# Patient Record
Sex: Male | Born: 1965 | Race: White | Hispanic: No | Marital: Married
Health system: Southern US, Academic
[De-identification: ages and names within clinical notes are randomized; demographics above are authoritative.]

## PROBLEM LIST (undated history)

## (undated) DIAGNOSIS — E783 Hyperchylomicronemia: Secondary | ICD-10-CM

## (undated) DIAGNOSIS — R7303 Prediabetes: Secondary | ICD-10-CM

## (undated) DIAGNOSIS — I10 Essential (primary) hypertension: Secondary | ICD-10-CM

## (undated) HISTORY — DX: Hyperchylomicronemia: E78.3

## (undated) HISTORY — DX: Prediabetes: R73.03

## (undated) HISTORY — DX: Essential (primary) hypertension: I10

---

## 2018-01-07 ENCOUNTER — Ambulatory Visit (INDEPENDENT_AMBULATORY_CARE_PROVIDER_SITE_OTHER): Payer: No Typology Code available for payment source | Admitting: Pediatrics

## 2018-01-07 ENCOUNTER — Encounter (INDEPENDENT_AMBULATORY_CARE_PROVIDER_SITE_OTHER): Payer: Self-pay | Admitting: Pediatrics

## 2018-01-07 VITALS — BP 156/91 | HR 65 | Ht 66.93 in | Wt 159.0 lb

## 2018-01-07 DIAGNOSIS — E781 Pure hyperglyceridemia: Secondary | ICD-10-CM

## 2018-01-07 NOTE — Progress Notes (Signed)
[facility] MEDICAL OFFICE [facility]  [facility] ENDOCRINE  [address]  [place] [address]  [phone]        Patient name:  Jimmy Ramirez    Referring Provider:   MRN:  [id number]  PCP: none  Date of Birth:  16-Nov-1965  Date of Service:  01/07/2018    Informant: patient and his wife    History of Present Illness:  Jimmy Ramirez is a 52 y.o. 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 Essie Christine sees nephew Jimmy Ramirez, [place], with Jeral Pinch Syndrome and concern for elevated TG/cholesterol levels). At [facility] most recent appointment, Hansen's wife was present and concerned for Elick and therefore wanted to see endocrinology.    Routine blood work in past with extremely elevated TG >4000. He has had extensive workup for this and has decreased carbs in diet and eats a keto-diet. His TG decreased but he himself was still concerned so in 07/06/2000 he ordered labwork which showed elevated total cholesterol 334 and TG 768 with a low HDL 29. Liproprotein type IV, absent chylomicrons. No history of pancreatitis. In the past 3 years, he notes that he was recently diagnosed with HTN (lisinopril 40mg  qDaily) and open-angle glaucoma (Dr. Harrel Lemon in [place]). Kahne states he started himself on atorvastatin 20mg  daily due to elevated cholesterol to see if that would help decrease it further. He also mentions elevated blood viscosity for which he has underwent plasmapheresis. He continues to stay active: training for triathlons, Crossfit 3x/week, etc.     3 years ago, in 2016, [place] sister had venous sinus thrombosis in head. She received Pradaxa (dabigatran etexilate) and since then, cholesterol levels and TG all improved and now within normal range. Sister has antiphospholipidemia and "chylomicronemia." "Chylomicronemia" also in father and 2 paternal aunts.     Has a 70 year old daughter who has not had any workup for hypertriglyceridemia.     Past Medical History:   Previous Hospitalizations:  none  Previous Surgical Procedures: b/l hernia repair. Right ring finger with plate and screw.    Patient Active Problem List   Diagnosis   . Hypertriglyceridemia, familial     Outpatient Encounter Medications as of 01/07/2018   Medication Sig Dispense Refill   . atorvastatin (LIPITOR) 20 mg Oral Tablet TAKE 1 TABLET BY MOUTH EVERY DAY  4   . lisinopril (PRINIVIL) 40 mg Oral Tablet TAKE 1 TABLET BY MOUTH EVERY DAY  4     No facility-administered encounter medications on file as of 01/07/2018.       NKDA.    Immunization: Up to date.    Diet: Well-balanced.     Family History: Sister with antiphospholipidemia and "chylomicronemia." "Chylomicronemia" also in father and 2 paternal aunts.     Social History: Works as a Advice worker; surgical and dermatology in past. Lives with wife and 13yo daughter.    Review of Systems:   Constitutional: Good energy, no unexplained weight loss   HEENT: No sinus infection, runny nose, cough, sore throat  Eyes: No photophobia, redness   CVS: No chest pain, palpitation, murmur   Resp: No dyspnea, wheezing.  GI: No constipation, no vomiting, diarrhea, abdominal pain   GU: No hematuria, dysuria, or urgency  MS: No joint pain, swelling, limping   Skin: No rash, pruritus, lesions   Endocrine: No thyroid problem, polyuria, polydipsia   All/Imm: No conjunctivitis, wheezing, frequent infections   Heme: No easy bruising, no epistaxis  Neuro: No headache, seizures, muscle  weakness   Psychiatric: No depression, conduct disorder     PHYSICAL EXAM:  BP (!) 156/91   Pulse 65   Ht 1.7 m (5' 6.93")   Wt 72.1 kg (158 lb 15.2 oz)   BMI 24.95 kg/m     General: NAD. Wears glasses.  Skin: Color and turgor normal.  HEENT: Throat without exudates or redness, tonsils not enlarged, neck supple, full ROM, no lymphadenopathy and thyroid not enlarged. Cobblestoning of posterior oropharynx.   Eyes: No ptosis or nystagmus, sclera and conjunctiva normal   Ears:  External ear normal, no drainage, tags, or pits    Respiratory: Clear to auscultation b/l, no RRW.  Cardiovascular: S1 and S2 present, no MRG.  GI: Abdomen soft, non-tender, non-distended, + bowel sounds, no HSM.  Musculoskeletal: Moves all 4 extremities well, no joint swelling, erythema, and tenderness, gross deformity, muscular atrophy or appliances   Neurologic: Alert, oriented, cranial nerves grossly intact, normal, gross motor movement normal.  Psychiatric: Appropriate mood and affect. Non-tangential speech. Answered questions appropriately. Speech with normal tone and volume.    ASSESSMENT/PLAN:      Type IV Dyslipidemia (Familial hypertriglyceridemia)  Elevated BP  Open-angle glaucoma  Allergic rhinits    - Repeat fasting labs tomorrow: CMP, lipid panel. Consider further advanced testing for dyslipidemia (angioproteins, etc). Send genetics for ApoE2 mutation analysis. Continue low carb diet and healthy fats as a part of paleo diet.  - Recommend [place] establish care with a PCP to help coordinate care.  - Recommend referral to cardiologist by PCP for elevated BP.    I have seen and examined the patient. I agree with Dr.Lim's note above with some modifications that I have made.    Total time spent with patient/family  30  minutes with more than 50% in counseling.      Altamese Carolina, MD PhD  Section Head, Pediatric Endocrinology  Associate Professor of Pediatrics,  [place], [place] Division

## 2018-01-11 ENCOUNTER — Telehealth (INDEPENDENT_AMBULATORY_CARE_PROVIDER_SITE_OTHER): Payer: Self-pay | Admitting: Pediatrics

## 2018-01-11 NOTE — Telephone Encounter (Signed)
Called about getting labs done. He will call us Monday to fax lab results. He also needs a follow up in a few months.

## 2018-03-11 ENCOUNTER — Ambulatory Visit (INDEPENDENT_AMBULATORY_CARE_PROVIDER_SITE_OTHER): Payer: No Typology Code available for payment source | Admitting: Pediatrics

## 2018-03-11 ENCOUNTER — Encounter (INDEPENDENT_AMBULATORY_CARE_PROVIDER_SITE_OTHER): Payer: Self-pay | Admitting: Pediatrics

## 2018-03-11 VITALS — BP 146/80 | HR 59 | Ht 69.13 in | Wt 162.3 lb

## 2018-03-11 DIAGNOSIS — J309 Allergic rhinitis, unspecified: Secondary | ICD-10-CM

## 2018-03-11 DIAGNOSIS — R030 Elevated blood-pressure reading, without diagnosis of hypertension: Secondary | ICD-10-CM

## 2018-03-11 DIAGNOSIS — E781 Pure hyperglyceridemia: Principal | ICD-10-CM

## 2018-03-12 NOTE — Progress Notes (Signed)
[facility]  [facility]  [address]  [place] [id number]  [phone]        Patient name:  Jimmy Ramirez    Referring Provider:   MRN:  [id number]  PCP: none  Date of Birth:  1965-12-02  Date of Service:  03/11/2018    Informant: patient and his wife    History of Present Illness:  Jimmy Ramirez is a 52 y.o. 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 Essie Ramirez sees nephew Jimmy Ramirez(Jimmy Ramirez, [place]14yo, with Jeral Pinchowns Syndrome and concern for elevated TG/cholesterol levels). At [facility], Jimmy Ramirez's wife was present and concerned for Jimmy Ramirez and therefore wanted to see endocrinology.    Routine blood work in past with extremely elevated TG >4000. He has had extensive workup for this and has decreased carbs in diet and eats a keto-diet. His TG decreased but he himself was still concerned so in 07/06/2000 he ordered labwork which showed elevated total cholesterol 334 and TG 768 with a low HDL 29. Liproprotein type IV, absent chylomicrons. No history of pancreatitis. In the past 3 years, he notes that he was recently diagnosed with HTN (lisinopril 40mg  qDaily) and open-angle glaucoma (Jimmy Ramirez in [place]). Jimmy Ramirez states he started himself on atorvastatin 20mg  daily due to elevated cholesterol to see if that would help decrease it further. He also mentions elevated blood viscosity for which he has underwent plasmapheresis. He continues to stay active: training for triathlons, Crossfit 3x/week, etc.     3 years ago, in 2016, Jimmy Ramirez's sister had venous sinus thrombosis in head. She received Jimmy Ramirez (dabigatran etexilate) and since then, cholesterol levels and TG all improved and now within normal range. Sister has antiphospholipidemia and "chylomicronemia." "Chylomicronemia" also in father and 2 paternal aunts.     Has a 51 year old daughter who has not had any workup for hypertriglyceridemia.     Interval Hx: Since his last visit 2 months ago he has been  more tired. He was on testosterone shots before but these were held due to polycythemia 5 months ago. He also continues to challenge his diet wanting to fast 16 hrs and eat 8 hrs with more paleo diet than keto diet. Labwork 12/2017 which showed elevated total cholesterol 295 and TG 678 with a low HDL 35, low testosterone 258 with high normal LH and FSH.    Past Medical History:   Previous Hospitalizations: none  Previous Surgical Procedures: b/l hernia repair. Right ring finger with plate and screw.    Patient Active Problem List   Diagnosis   . Hypertriglyceridemia, familial     Outpatient Encounter Medications as of 03/11/2018   Medication Sig Dispense Refill   . atorvastatin (LIPITOR) 20 mg Oral Tablet TAKE 1 TABLET BY MOUTH EVERY DAY  4   . lisinopril (PRINIVIL) 40 mg Oral Tablet TAKE 1 TABLET BY MOUTH EVERY DAY  4     No facility-administered encounter medications on file as of 03/11/2018.       NKDA.    Immunization: Up to date.    Diet: Well-balanced.     Family History: Sister with antiphospholipidemia and "chylomicronemia." "Chylomicronemia" also in father and 2 paternal aunts.     Social History: Works as a Advice workerphysician assistant; surgical and dermatology in past. Lives with wife and 13yo daughter.    Review of Systems:   Constitutional: Good energy, no unexplained weight loss   HEENT: No sinus infection, runny nose, cough, sore  throat  Eyes: No photophobia, redness   CVS: No chest pain, palpitation, murmur   Resp: No dyspnea, wheezing.  GI: No constipation, no vomiting, diarrhea, abdominal pain   GU: No hematuria, dysuria, or urgency  MS: No joint pain, swelling, limping   Skin: No rash, pruritus, lesions   Endocrine: No thyroid problem, polyuria, polydipsia   All/Imm: No conjunctivitis, wheezing, frequent infections   Heme: No easy bruising, no epistaxis  Neuro: No headache, seizures, muscle weakness   Psychiatric: No depression, conduct disorder     PHYSICAL EXAM:  BP (!) 146/80 (Site: Right, Cuff Size:  Adult Large)   Pulse 59   Ht 1.756 m (5' 9.13")   Wt 73.6 kg (162 lb 4.1 oz)   BMI 23.87 kg/m     General: NAD. Wears glasses.  Skin: Color and turgor normal.  HEENT: Throat without exudates or redness, tonsils not enlarged, neck supple, full ROM, no lymphadenopathy and thyroid not enlarged. Cobblestoning of posterior oropharynx.   Eyes: No ptosis or nystagmus, sclera and conjunctiva normal   Ears:  External ear normal, no drainage, tags, or pits   Respiratory: Clear to auscultation b/l, no RRW.  Cardiovascular: S1 and S2 present, no MRG.  GI: Abdomen soft, non-tender, non-distended, + bowel sounds, no HSM.  Musculoskeletal: Moves all 4 extremities well, no joint swelling, erythema, and tenderness, gross deformity, muscular atrophy or appliances   Neurologic: Alert, oriented, cranial nerves grossly intact, normal, gross motor movement normal.  Psychiatric: Appropriate mood and affect. Non-tangential speech. Answered questions appropriately. Speech with normal tone and volume.    ASSESSMENT/PLAN:      Type IV Dyslipidemia (Familial hypertriglyceridemia)  Elevated BP  Open-angle glaucoma  Allergic rhinitis  Low testosterone    - Repeat fasting labs show normal LH and FSH but low testosterone, high TGs and cholesterol. Consider further advanced genetic testing for dyslipidemia ApoE2 mutation analysis. Consider sending a sample through [facility]. Gave him a specimen box.   - Continue low carb diet and healthy fats as a part of paleo diet with 16:8 protocol  - Recommend Jimmy Ramirez establish care with a PCP to help coordinate care. Consider taking clomid for low testosterone levels.  - Recommend referral to cardiologist by PCP for elevated BP.  - Consider starting low dose metformin as that may help with insulin sensitization. Hold if plans to fast in the morning.    Follow up in 4-6 months.    Total time spent with patient/family  30  minutes with more than 50% in counseling.      Altamese Carolina, MD PhD  Section Head,  Pediatric Endocrinology  Associate Professor of Pediatrics,  [place], [place] Division

## 2018-05-06 ENCOUNTER — Telehealth (INDEPENDENT_AMBULATORY_CARE_PROVIDER_SITE_OTHER): Payer: Self-pay | Admitting: Pediatrics

## 2018-05-06 DIAGNOSIS — E781 Pure hyperglyceridemia: Secondary | ICD-10-CM

## 2018-05-06 NOTE — Telephone Encounter (Signed)
Patient is calling asking about a cardiology referral and if you had any recommendations for him to see someone. He said it is about Chylomicronemia. He also asked if you or someone in the office could give him a call back

## 2018-05-24 NOTE — Telephone Encounter (Signed)
Jimmy Carolina, MD  You 50 minutes ago (12:05 PM)      I have referred him to Dr. Jetta Lout adult cardiology as he is a complicated patient.    Routing comment

## 2018-06-10 ENCOUNTER — Telehealth (INDEPENDENT_AMBULATORY_CARE_PROVIDER_SITE_OTHER): Payer: Self-pay | Admitting: Pediatrics

## 2018-06-10 NOTE — Telephone Encounter (Signed)
Spoke with [name] regarding his appointment with Dr. Jetta Lout on Jun 18, 2018 @945  am

## 2022-05-17 ENCOUNTER — Ambulatory Visit (HOSPITAL_COMMUNITY): Payer: Self-pay | Admitting: Family Medicine

## 2022-12-14 IMAGING — MR MRI LUMBAR SPINE WITHOUT CONTRAST
4 of 6 series · 30 of 48 positions shown · IV contrast (gadolinium)
Comparison: None available.

﻿EXAM:  [id number]   MRI LUMBAR SPINE WITHOUT CONTRAST
INDICATION: 56-year-old with right gluteal pain.  Low back pain.  Sustained injury running. No history of back surgery or malignancy.
TECHNIQUE: Multiplanar multisequential MRI of the  was performed without gadolinium contrast.

[Series 5: T2 · sagittal · 4.0mm · 0.94mm/px · 7 of 13 slices shown (1 of 3)]
[im 1/13]
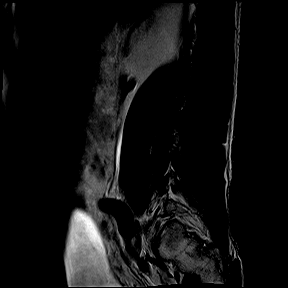
[im 3/13]
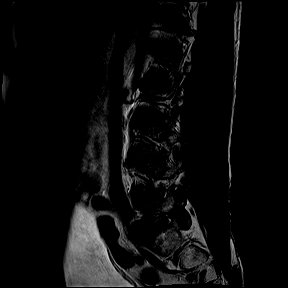
[im 5/13]
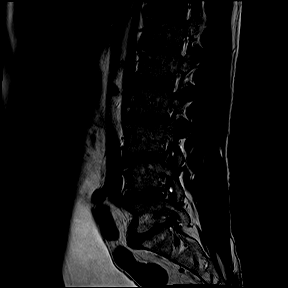
[im 7/13]
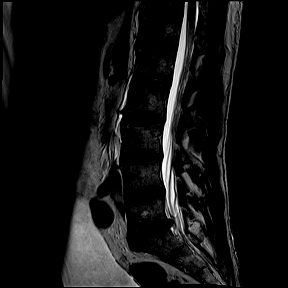
[im 9/13]
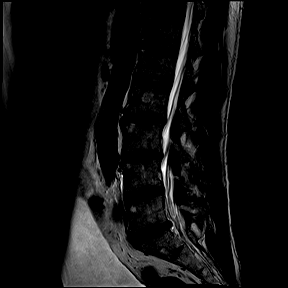
[im 11/13]
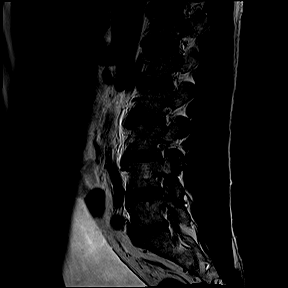
[im 13/13]
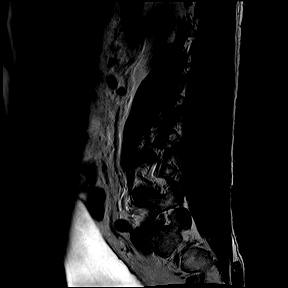

[Series 6: T1 · sagittal · 4.0mm · 0.94mm/px · 6 of 13 slices shown]
[im 1/13]
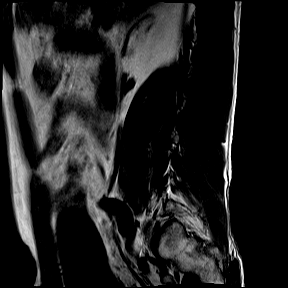
[im 3/13]
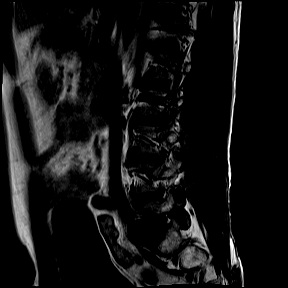
[im 5/13]
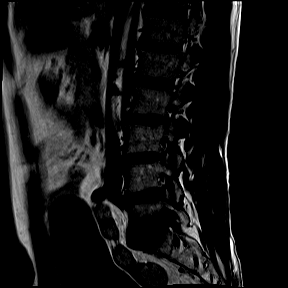
[im 8/13]
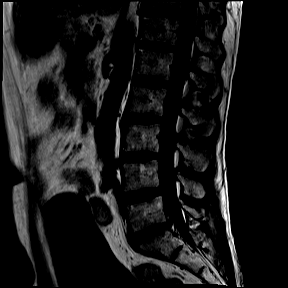
[im 10/13]
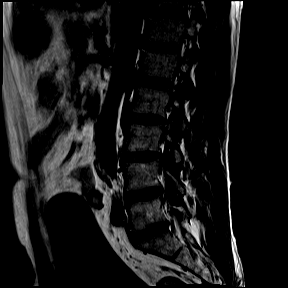
[im 13/13]
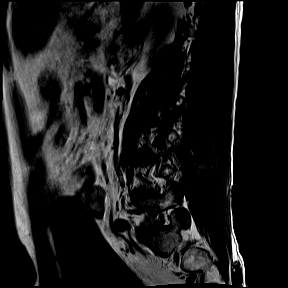

[Series 8: T2 · coronal · 5.0mm · 0.82mm/px · 9 of 18 slices shown (2 of 3)]
[im 1/18]
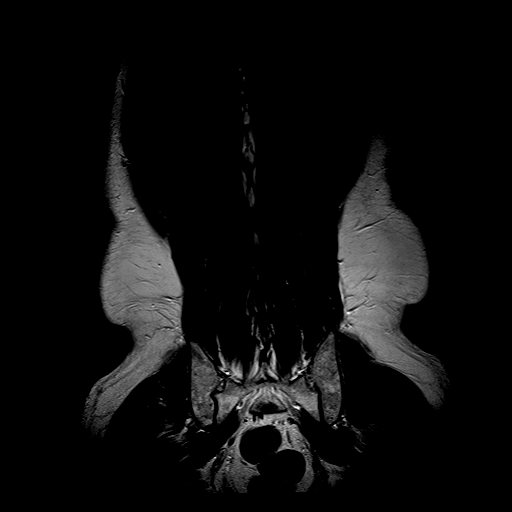
[im 3/18]
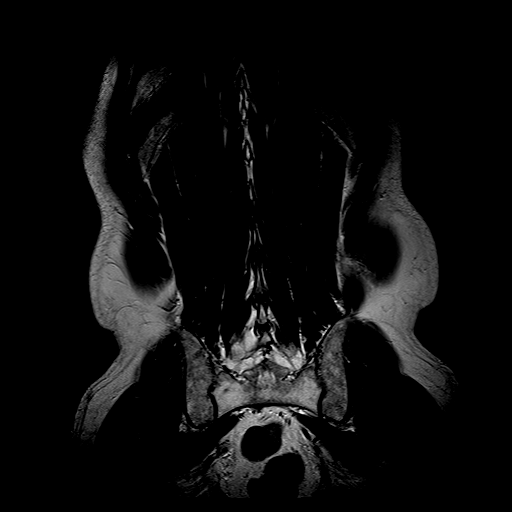
[im 5/18]
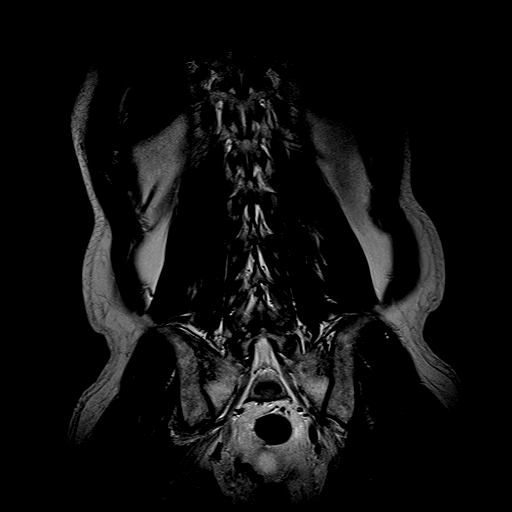
[im 7/18]
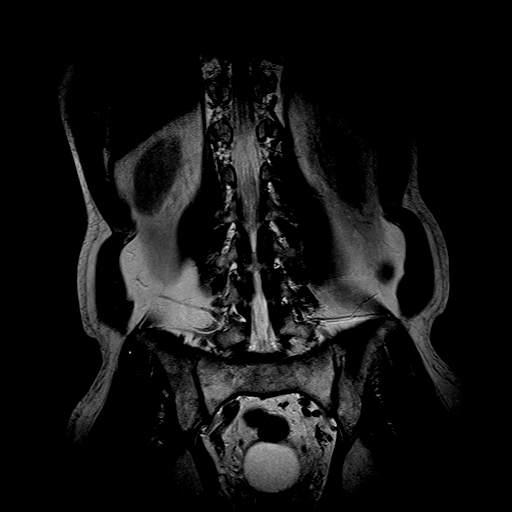
[im 9/18]
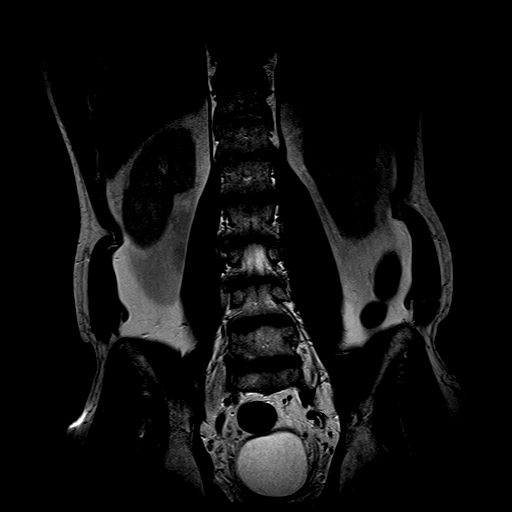
[im 11/18]
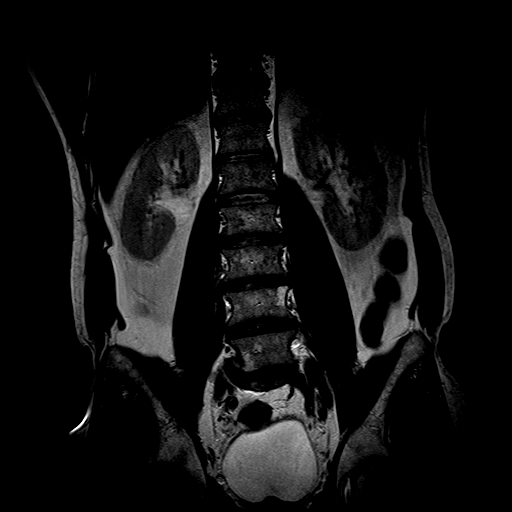
[im 13/18]
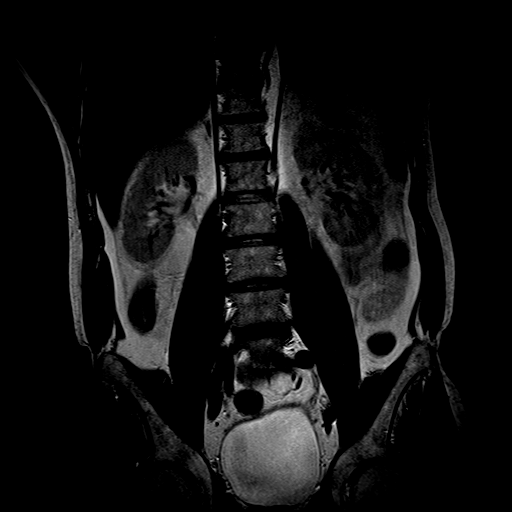
[im 15/18]
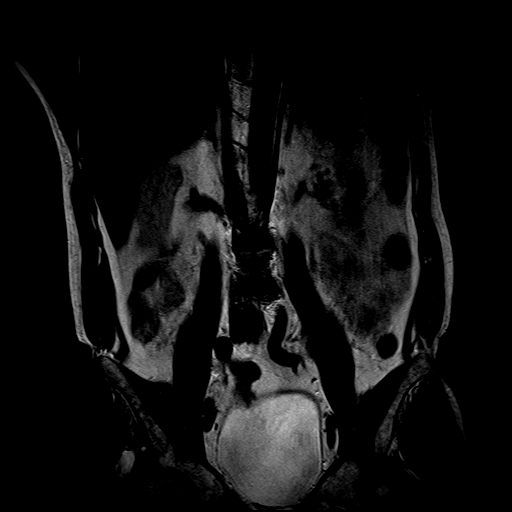
[im 18/18]
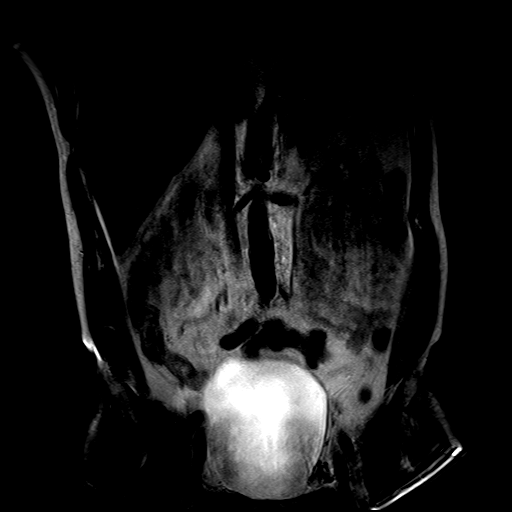

[Series 9: T2 · axial · 4.0mm · 0.52mm/px · z∈[-143,+58]mm · 8 of 20 slices shown (3 of 3)]
[im 1/20]
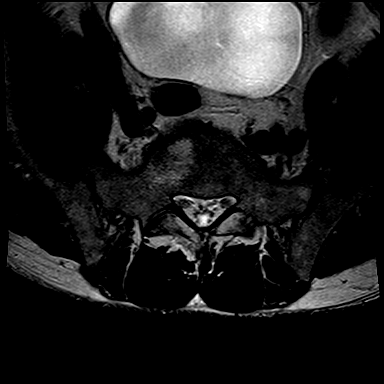
[im 3/20]
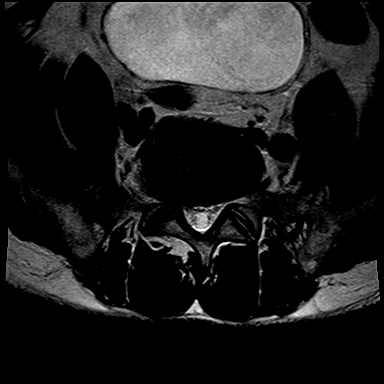
[im 7/20]
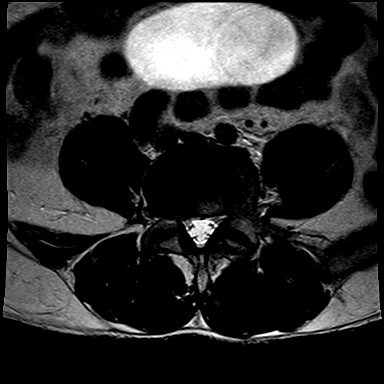
[im 9/20]
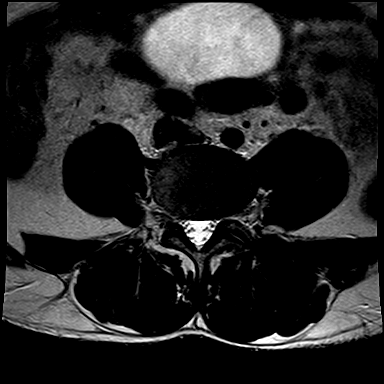
[im 11/20]
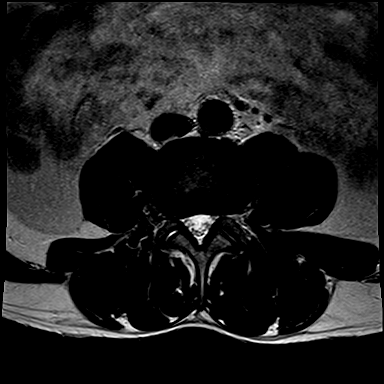
[im 13/20]
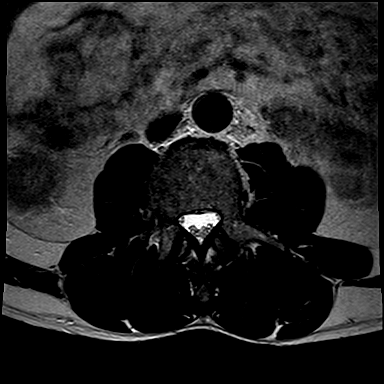
[im 17/20]
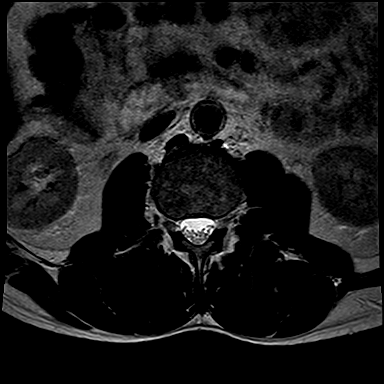
[im 20/20]
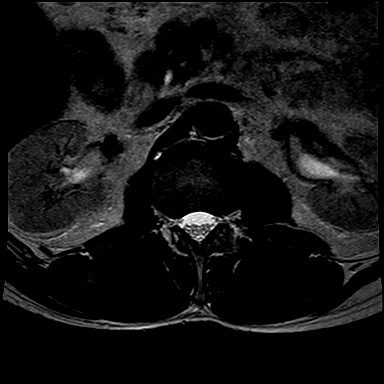

[30 of 48 positions shown; findings below may reference images not displayed]

FINDINGS: Significant Modic type 1 inflammatory changes on both sides of L5-S1 disc is noted, predominantly on the right side. 

Conus terminates at T12-L1 level. Cauda equina structures are normal in the sagittal and axial projections.

At L1-2 level, no focal disc lesions are seen. Mild facet arthropathy is noted without compromise of thecal sac or neural foramina. 

At L2-3 level, degenerative disc disease and facet arthropathy are noted with asymmetric bulging annulus to the right causing moderate compromise of right neural foramina and minimal compromise of left neural foramina.

At L3-4 level, degenerative disc disease and facet arthropathy are noted causing moderate biforaminal narrowing.  AP diameter of thecal sac in the midline measures 11.7 mm. 

At L4-5 level, degenerative disc disease is noted with significant facet arthropathy causing significant compromise of right lateral recess and neural foramina, moderate compromise of left neural foramina.

At L5-S1 level, significant degenerative disc disease with minimal retrolisthesis.  Significant, asymmetric bulging annulus to the right along with facet arthropathy is causing significant right foraminal stenosis impinging on the right L5 nerve root.  Modic inflammatory changes on the right side at L5-S1 level is noted on both sides.

Paravertebral soft tissues are unremarkable.  On the coronal images, significant degenerative changes of the acetabular labrum is suggested in the superior aspect of the right hip with a large probable paralabral cyst, 2 x 1.4 cm in coronal dimensions.
IMPRESSION: 1. Significant degenerative changes with Modic type 1 inflammatory changes on both sides of L5-S1 disc predominantly on the right side. 

2.  At L5-S1 level, significant degenerative disc disease with minimal retrolisthesis.  Significant, asymmetric bulging annulus to the right along with facet arthropathy is causing significant right foraminal stenosis impinging on the right L5 nerve root.  Modic inflammatory changes on the right side at L5-S1 level is noted on both sides.

3. Findings at other disc levels are described above in detail.

4. In the coronal images, degenerative changes of the acetabular labrum with a large paralabral cyst is suggested.  Images of the hips are incompletely evaluated in this examination of MRI lumbar spine.

## 2023-10-18 ENCOUNTER — Other Ambulatory Visit (HOSPITAL_COMMUNITY): Payer: Self-pay | Admitting: PSYCHIATRY AND NEUROLOGY-NEUROLOGY

## 2023-10-18 DIAGNOSIS — G20A1 Parkinson's disease without dyskinesia, without mention of fluctuations: Secondary | ICD-10-CM

## 2023-10-23 ENCOUNTER — Other Ambulatory Visit (HOSPITAL_COMMUNITY): Payer: Self-pay | Admitting: PSYCHIATRY AND NEUROLOGY-NEUROLOGY

## 2023-10-23 DIAGNOSIS — G20A1 Parkinson's disease without dyskinesia, without mention of fluctuations: Secondary | ICD-10-CM

## 2023-11-01 ENCOUNTER — Ambulatory Visit (INDEPENDENT_AMBULATORY_CARE_PROVIDER_SITE_OTHER): Payer: Self-pay | Admitting: NEUROLOGY

## 2023-11-15 ENCOUNTER — Ambulatory Visit (HOSPITAL_BASED_OUTPATIENT_CLINIC_OR_DEPARTMENT_OTHER): Payer: Self-pay

## 2023-11-15 ENCOUNTER — Encounter (INDEPENDENT_AMBULATORY_CARE_PROVIDER_SITE_OTHER): Payer: Self-pay | Admitting: Neurology

## 2023-11-15 ENCOUNTER — Other Ambulatory Visit: Payer: Self-pay

## 2023-11-15 ENCOUNTER — Ambulatory Visit: Payer: Self-pay | Attending: Neurology | Admitting: Neurology

## 2023-11-15 VITALS — BP 146/88 | HR 73 | Temp 97.5°F | Wt 168.9 lb

## 2023-11-15 DIAGNOSIS — R251 Tremor, unspecified: Secondary | ICD-10-CM | POA: Insufficient documentation

## 2023-11-15 DIAGNOSIS — R7303 Prediabetes: Secondary | ICD-10-CM

## 2023-11-15 DIAGNOSIS — G250 Essential tremor: Secondary | ICD-10-CM

## 2023-11-15 DIAGNOSIS — I10 Essential (primary) hypertension: Secondary | ICD-10-CM

## 2023-11-15 DIAGNOSIS — E783 Hyperchylomicronemia: Secondary | ICD-10-CM | POA: Insufficient documentation

## 2023-11-15 MED ORDER — PRIMIDONE 50 MG TABLET
50.0000 mg | ORAL_TABLET | Freq: Every evening | ORAL | 1 refills | Status: DC
Start: 2023-11-15 — End: 2023-12-04

## 2023-11-15 NOTE — H&P (Signed)
 [facility] Department of Neurosurgery      Operated by [employer]  History and Physical  Date:  11/16/2023  Name: Jimmy Ramirez  MRN: [id number]  Age:  58 y.o.  Referring Physician: No referring provider defined for this encounter.    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     Jimmy Ramirez is here with his wife, Rocky.     History Obtained from:  Patient, Family, and medical chart    HPI: I had the pleasure of seeing Jimmy Ramirez in the [facility] clinic. Jimmy Ramirez is a 58 y.o. RHD male with PMH significant for hypertriglyceridemia, HTN, chylomicronemia (follows with a cardiologist - underwent recent CTA which he reports is normal), prediabetes, and tremor presents today for surgical evaluation of tremor. He endorses a 7 year history of tremor first noticed in both hands and has progressed to LLE. He endorses increasing difficulty with running and balance. He is an avid runner and exercises 6x/week. He states the tremor is worse at rest and with anxiety and improves with activity. He recently underwent a skin biopsy that was positive for alpha synuclein; results from Dr. Nanetta office are scanned into Careers information officer. 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 While he has not noticed his tremor causing issues with his fine motor tasks at work, his rest tremor and slowness/fatigue is affecting his work and patient interactions. He has had to decrease his procedures from 12 to 10 per week and is making work adjustments to accommodate for increasing slowness and tremor. He has tried primidone , propranolol, and sinemet in the past at low doses. He would like to be able to continue working and performing procedures without interruption.      CC:    Chief Complaint   Patient presents with    New Patient       Onset: 7 years ago - b/l hand tremor that has progressed to LLE tremor  Location/laterality: L>R  Provocation: rest, anxiety  Alleviating: activity  Impact on ADL's/QOL: impacts interactions with patients and ability to work - has had to decrease number of procedure cases per week. Patients comment on his tremor during evaluations.      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; he is also a white water guide     Tremor ROS:     Motor:  Resting tremor: LUE, LLE  Rigidity: Not present  Freezing: Denies  Bradykinesia: Wife states he does have slowness of movement and becomes much slower throughout the day  Postural instability/increase in falls: Has noticed some balance issues when running    Cognitive:   Word finding difficulty: Denies  Changes in level of consciousness, cognition or memory: Denies    Psychologically:  REM sleep behavior: Has some issues acting out dreams  Vivid dreams: Present  Impulse control: Denies  Depression: Denies  Visual hallucinations: Denies    Dysautonomic:  Orthostatic  hypotension: Denies  Constipation: Denies  Temperature dysregulation: Denies  Anosmia: Does endorse reduced SOS      Family history of tremor: Denies family Hx of PD or ET    Pertinent medical history: hypertriglyceridemia, HTN, chylomicronemia (follows with a cardiologist - underwent recent CTA which he reports is normal), prediabetes    Review of Systems  Other than ROS in the HPI, all other systems were negative.      Medications:   Outpatient Medications Marked as Taking for the 11/15/23 encounter (Office Visit) with Nasya Vincent R, MD    Medication Sig    amLODIPine (NORVASC) 10 mg Oral Tablet     cholecalciferol, vitamin D3, 25 mcg (1,000 unit) Oral Tablet Take 1 Tablet (1,000 Units total) by mouth Daily    dapagliflozin propanediol (FARXIGA) 10 mg Oral Tablet Take 0.5 Tablets (5 mg total) by mouth Once a day    dapagliflozin propanediol (FARXIGA) 5 mg Oral Tablet Take 1 Tablet (5 mg total) by mouth Daily    fenofibrate (LOFIBRA) 160 mg Oral Tablet Take 1 Tablet (160 mg total) by mouth Daily    lisinopriL (PRINIVIL) 20 mg Oral Tablet Take 1 Tablet (20 mg total) by mouth Twice daily    primidone  (MYSOLINE ) 50 mg Oral Tablet Take 1 Tablet (50 mg total) by mouth Every night    REPATHA SURECLICK 140 mg/mL Subcutaneous Pen Injector Subcutaneous, 0 Refill(s)       Allergies: Allergies[1]    Family History:   Family Medical History:    None           Surgical History:         Social History:    Social History     Socioeconomic History    Marital status: Single   Tobacco Use    Smoking status: Never    Smokeless tobacco: Never         PHYSICAL EXAM:    Constitutional:  BP (!) 146/88   Pulse 73   Temp 36.4 C (97.5 F) (Thermal Scan)   Wt 76.6 kg (168 lb 14.4 oz)   SpO2 97%   BMI 24.85 kg/m       General:   Well developed  NAD  Eyes:   PERLLA  EOMI   ENT:   No otorrhea or rhinorrhea.   Overall, good dentition.   Cardiovascular:   Lower extremities warm and well perfused. 2+ posterior tibial and radial pulses bilaterally.  Lungs:   Respiratory rate unlabored.   GI/GU:  Abdomen is soft   Psychiatric:  Attention, mood, and concentration all appropriate.   Integumentary:   no rashes or bruising.    Musculoskeletal:  Muscle strength (upper extremities): 5/5  Muscle strength (lower extremities): 5/5  Tenderness Denies  ROM Full ROM  Neurologic:   Mental Status: alert & oriented x4, follows commands.   Knowledge: Appropriate  Language: Noaphasia  Speech: No dysarthria  Cranial Nerves:  2 No Visual Defect on Confrontation; Pupils round, equal, reactive to  light  3,4,6Extraocular Movements Intact; no nystagmus  5 Facial Sensation Intact  7 No facial asymmetry  8 Intact hearing  9,10 Palate symmetric  11 Good shoulder shrug  12 Tongue Midline  Sensory: Intact to light touch.   Gait:    - Able to rise from chair without the use of arms.   -stride - WNL  -pace -  WNL  -base - WNL  -posture -WNL   - Able to tandem gait  Motor:   -  Tremor: resting, postural and action tremor L>R   - Vocal tremor Not present  - Head/chin tremor: Present   - Tone: WNL   - Finger taps: Irregular B/L    - Foot stomps Irregular B/L    - Coordination: No ataxia on exam  (finger to nose testing)    Deep Tendon Reflexes: 1-2+ throughout  - Hoffman: Negative  - Clonus: Negative    Data reviewed  Images and labs have been reviewed with cosigning physician and compared to previous as available.     Discussion with other providers:   Dr. Annis - medication optimization is warranted at this time. Patient to return with Neurology in  3 months, will re-evaluate at this time. Patient to obtain CT HIFU and MRI BMD when he returns for 3 month FU with Neurology Movement.     Medicare DBS National Coverage Determination:      ESSENTIAL TREMOR:   A. The patient has a diagnosis of essential tremor based on postural or kinetic tremors Yes  B. Using the TETRAS grading scale, the patient has been diagnosed with at least a grade 3 tremor that is causing significant limitation in daily activity despite optimal medical therapies. Yes  C. The patient demonstrates the necessary willingness and ability to cooperate during conscious operative procedure, as well as during post-surgical evaluations, adjustments of medications and stimulator settings Yes                 Assessment:      ICD-10-CM    1. 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 Hypertension, unspecified type  I10       3. Prediabetes  R73.03           Treatment Plan:    Jimmy Ramirez is a 59 y.o. 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    The patient has progressive and debilitating tremor that is refractory to medications and interfering with QOL and ADL's. The patient would like to be considered for surgical intervention for treatment of their tremor once medication optimization fails.     We discussed risks, benefits, and expected outcomes of both DBS vs HIFU procedures. He is primarily interested in HIFU but is considering DBS given his head/neck component and ability to customize and fine tune settings as disease progresses. Patient to obtain CT HIFU, NP testing, and MRI BMD ordered during Neurology visit when he returns for 3 month FU with Neurology Movement.      I reviewed the patients past medical, social, surgical, and family histories. I reconciled all of their home medications and allergies.       We will discuss the patient in multidisciplinary movement disorder conference once all testing has been completed.        The patient was seen as a shared visit with the co-signing faculty.    I independently of the faculty provider spent a total of (35) minutes in direct/indirect care of this patient including initial evaluation, review of laboratory, radiology, diagnostic studies, review of medical record, patient/ family education, order entry and coordination of care.     Jimmy Eveline Bare, APRN, ACNPC-AG, CCRN  APP, Stereotactic and Functional Neurosurgery - [facility]  11/16/2023, 16:41    With Dr. Precilla    I personally saw and examined the patient. See Nurse Practitioner's note for additional details.  Patient with tremor that is progressively disabling and treatment refractory.  Patient is  a potential candidate for HIFU or DBS in particular given his bilateral symptoms.  Will discuss at patient conference.     Jimmy Dilger R Jenisis Harmsen, MD            [1] No Known Allergies

## 2023-11-15 NOTE — Progress Notes (Addendum)
 Date: 11/15/2023  Name: Jimmy Ramirez  MRN: [id number]  AGE: 58 y.o.  REFERRING PHYSICIAN: No referring provider defined for this encounter.    PCP: No Pcp    CC: New Patient and Tremors      History Obtained From: Patient, spouse, medical chart review    HPI: This is a 58 y.o. year old RH male with PMH significant for chylomicronemia, HTN, prostate cancer (being monitored)who presents for evaluation of Parkinson's disease and discussion of surgical treatment options.     His symptoms started at 58 yo. He mentions he was in [place] in the 80's. His tremor started in both hands with action, later became more prominent resting tremor in his left hand. After a while, his left leg started having tremor as well. He mentions he has cramps. He had skin biopsy done which was positive for alpha synuclein (1 out of 3 biopsy sites).     No significant issues walking, but his running isn't as effective. His balance is off, left arm isn't as mobile as the right one. No recent falls. Cramping in the legs, sometimes waking him up at night. No toe curling. No issues buttoning buttons. No major changes in writing. Wife thinks that he talks softer. He gets tired in the evening. Sleep isn't good.     Caffeine and anxiety makes tremor worse.     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 Tremor isn't affecting the sawing, but his patients notice it.     Sense of smell is reduced (never been good). No constipation. He does have vivid dreams. Wife says he does sometimes act out his dreams (tried to even leave the house before). Snores at night, never had a sleep study but no apneas. No urinary accidents or retention.     No known family hx of PD or tremor. He says he had genetic testing which was negative. No known hx of thyroid disease. Last TSH 6 mo ago was fine per patient. Denibes tobacco, alcohjol recreational substance.    He was previously seen by Dr Milissa and also seen at [facility] before. Currently not on any  meds for PD.    He previuosly took Primidone  and Propranolol (or Metoprolol), ? Dose (patient could not remember), which initially worked, but then stopped working. Primidone  worked for a longer time compared to beta blocker. He tried Sinemet, help a bit, but then not, Dr AT Layman wanted to double the dose and he felt GI SE, dizziess. He took 1 tabs TID, but 2 TID caused SE).     Surgical goals:   - Would like to be able to work for as long as he can    Past Medical History: Chylomicronemia, HTN, prostate cancer      Medications:   Current Outpatient Medications   Medication Sig    amLODIPine (NORVASC) 10 mg Oral Tablet     atorvastatin (LIPITOR) 20 mg Oral Tablet TAKE 1 TABLET BY MOUTH EVERY DAY (Patient not taking: Reported on 11/15/2023)    cholecalciferol, vitamin D3, 25 mcg (1,000 unit) Oral Tablet Take 1 Tablet (1,000 Units total) by mouth Daily    dapagliflozin propanediol (FARXIGA) 10 mg Oral Tablet Take 0.5 Tablets (5 mg total) by mouth Once a day    dapagliflozin propanediol (FARXIGA) 5 mg Oral Tablet Take 1 Tablet (5 mg total) by mouth Daily    fenofibrate (LOFIBRA) 160 mg Oral Tablet Take 1 Tablet (160 mg total) by mouth Daily  lisinopriL (PRINIVIL) 20 mg Oral Tablet Take 1 Tablet (20 mg total) by mouth Twice daily    primidone  (MYSOLINE ) 50 mg Oral Tablet Take 1 Tablet (50 mg total) by mouth Every night    REPATHA SURECLICK 140 mg/mL Subcutaneous Pen Injector Subcutaneous, 0 Refill(s)           Allergies: Allergies[1]    Family History:   Family Medical History:    None         Surgical History: No recent surgeries        Social History:   Social History     Socioeconomic History    Marital status: Single   Tobacco Use    Smoking status: Never    Smokeless tobacco: Never       Review of Systems   Constitutional- No fever   Eyes- No visual change   ENT- hearing normal   Cv- no chest pain   Resp- No shortness of breath   Gi- no constipation   Gu- positive for prostate cancer    Ms- no  arthritis   Skin- no rash   Psych- no depression   Heme- no nodes    Physical Exam:    BP (!) 146/88   Pulse 73   Temp 36.4 C (97.5 F) (Thermal Scan)   Wt 76.6 kg (168 lb 14.4 oz)   SpO2 97%   BMI 24.85 kg/m     Appearance: No acute distress  Ophthalmoscopic: Limited by non-dilated pupils  Orientation: awake, alert, and oriented x3  Mental status:  Memory: appropriate per conversation with patient  Knowledge: appropriate  Language: no aphasia  Speech: no dysarthria  Cranial nerves:   2: Pupils round, equal,    3,4,6: EOMI, no nystagmus   5: facial sensation intact   7: no facial asymmetry   8: hearing grossly intact   9,10: palate symmetric   11: good shoulder shrug   12: tongue midline  Gait: Stands with arms crossed, normal base, pace and stride; re-emergent left hand tremor; no postural instability  Coordination: no ataxia with infer to nose testing   Mild head tremor; there is resting left hand tremor and BLE tremor, L > R; mild low amplitude high frequency postural tremor in bilateral hands with outstretched hands and wing beating position; bilateral action component L > R during finger to nose testing.     Finger taps and foot stomps irregular on the right. Finger taps with arrests on the left. Left foot stomps slightly irregular.     Sensory: intact to light touch in bilateral upper and lower extremities  Muscle Tone: Normal  Muscle Exam:   Deltoid 5  Biceps 5  Triceps 5  Hand grip 5      Iliopsoas 5  Quads 5  Hamstrings 5  Ankle dorsi flexion 5      Reflexes: 2/2 bilateral biceps, radialis, knees    Outside records: reviewed referral in the media, mentioning abnormal alpha-synuclein deposition in the thigh biopsy. Also reduced density of intraepidermal nerve fibers.         ICD-10-CM    1. Tremor  R25.1 MRI BRAIN MOVEMENT DISORDER PROTOCOL WO IV CONTRAST     CT HIFU BRAIN LAB WO IV CONTRAST     AMB CONSULT/REFERRAL NEUROPSYCH TESTING        Orders Placed This Encounter    MRI BRAIN MOVEMENT DISORDER  PROTOCOL WO IV CONTRAST    CT HIFU BRAIN LAB WO IV CONTRAST  AMB CONSULT/REFERRAL NEUROPSYCH TESTING    primidone  (MYSOLINE ) 50 mg Oral Tablet     * No order type specified *     Assessment/plan: This is a 58 y.o. year old right handed male with PMH significant for hylomicronemia, HTN, prostate cancer (Glyson 6, being monitored) who presents for evaluation of tremor and surgical treatment options. On exam patient has significant resting tremor in left upper extremity and bilateral lower extremities (L > R). No overt decrement to suggest bradykinesia yet, however his presentation appears to be most consistent with Parkinson's disease however ET with parkinsonism is also on the differential. Discussed surgical options DBS and HIFU. At this time, patient's symptoms are mild and we will try resuming medication treatment to see if tremor improves. Since tremor is the main symptom that is bothersome right now, will try Primidone  first. Patient initially expressed his interest most in HIFU, however after detailed discussion, including potential balance issues which are mostly transient, he decided to try Primidone  first and see how he does, and contemplate more about surgical options if needed in the future. Given concern for PD, DBS may be a better option given his young age and the fact that dexterity is an important part of his work (PA, Dermatology) while performing biopsies. His surgical goal is also reasonable (be able to work as long as possible). Will continue to monitor symptoms.       Plan:  - Ordered MRI brain wo movement protocol   - Ordered CT HIFU  - Ordered Neuropsych testing  - Start Primidone  for tremor 50 mg QHS; asked patient to message us  in 2 weeks and will discuss further dose adjustment  - Will discuss at [facility] conference    Leo Chough, MD    Movement Disorders Fellow  [facility]  11/15/23       I saw and examined the patient.  I reviewed the resident's note.  I agree with the findings  and plan of care as documented in the resident's note.  Any exceptions/additions are edited/noted.    Patient started with tremor 7 years ago, primarily bilateral action based tremor and tried primidone  and a beta blocker (unclear if it was metoprolol or propranolol) and unclear of the dosages. Patient reports there was benefit initially and then the benefit went away so he stopped the medications. Did not have side effects, and unclear how high of a dose he was on when it was discontinued. More recently, the tremor became more of a left arm and leg rest tremor and after a skin biopsy showing 1/3 alpha synuclein, he was diagnosed with PD. He was started on sinemet 25-100 mg 1 tab TID and there was initial tremor benefit, but then the benefit wore off. He was increased to 2 tabs TID but felt nauseous/dizzy on it and decided to discontinue. He is here to discuss HIFU. His tremor does have the appearance of a rest tremor, parkinsonian in appearance, but he has no other overt parkinsonian features. Could be early tremor-predominant parkinson's disease. If that is the case would first prefer medication optimization since he did have initial good response on multiple medications. Patient has some fears about sinemet-induced dyskinesias but did not actually have dyskinesias, and we discussed that there is no evidence to support holding off on treatment to reduce dyskinesias. This is complex because if he does have PD, he would likely benefit from DBS long-term as opposed to HIFU since he is young and may require adjustments in the  future. In addition, given the things that bother him are the dexterity and tremor interference with fine motor tasks (procedures related to dermatology, he is a PA), DBS would probably be better than HIFU. Finally, if he does have PD, that may alter what target we would want to select as STN would be the better long-term target compared to VIM. Patient is okay doing medication trial for the time  being. We selected primidone  since he reported having the best tremor response to this in the past, and that is the only symptom bothering him at this time. Will plan for an uptitration over several weeks to help the tremor. To be discussed at [facility] (neuropsych, MRI brain, and CT HIFU ordered). RTC 3 months with APP and 6 months with myself.     On the day of the encounter, a total of 100 minutes was spent on this patient encounter, including review of historical information, examination, documentation, and post-visit activities.     Harlene Cassis, MD  Movement Disorders  Assistant Professor of Neurology         [1] No Known Allergies

## 2023-11-15 NOTE — Patient Instructions (Addendum)
-   Plan for MRI brain, CT and neuropsychological testing    - Start Primidone  50 mg nightly.     - Message Dr Annis in 2 weeks and will discuss if/how to adjust Primidone  further    - Please try to set up MyChart    - Continue physical exercise as tolerated

## 2023-11-16 ENCOUNTER — Encounter (INDEPENDENT_AMBULATORY_CARE_PROVIDER_SITE_OTHER): Payer: Self-pay

## 2023-11-16 DIAGNOSIS — R7303 Prediabetes: Secondary | ICD-10-CM | POA: Insufficient documentation

## 2023-11-16 DIAGNOSIS — E783 Hyperchylomicronemia: Secondary | ICD-10-CM | POA: Insufficient documentation

## 2023-11-16 DIAGNOSIS — I10 Essential (primary) hypertension: Secondary | ICD-10-CM | POA: Insufficient documentation

## 2023-11-16 NOTE — Addendum Note (Signed)
 Addended by: ANNIS RAISIN on: 11/16/2023 08:24 AM     Modules accepted: Level of Service

## 2023-11-20 ENCOUNTER — Encounter (INDEPENDENT_AMBULATORY_CARE_PROVIDER_SITE_OTHER): Payer: Self-pay

## 2023-11-23 ENCOUNTER — Telehealth (INDEPENDENT_AMBULATORY_CARE_PROVIDER_SITE_OTHER): Payer: Self-pay

## 2023-11-29 ENCOUNTER — Encounter (INDEPENDENT_AMBULATORY_CARE_PROVIDER_SITE_OTHER): Payer: Self-pay

## 2023-11-29 ENCOUNTER — Encounter (INDEPENDENT_AMBULATORY_CARE_PROVIDER_SITE_OTHER): Payer: Self-pay | Admitting: Neurology

## 2023-12-04 ENCOUNTER — Encounter (INDEPENDENT_AMBULATORY_CARE_PROVIDER_SITE_OTHER): Payer: Self-pay | Admitting: Neurology

## 2023-12-04 MED ORDER — PRIMIDONE 50 MG TABLET
50.0000 mg | ORAL_TABLET | Freq: Two times a day (BID) | ORAL | 3 refills | Status: AC
Start: 2023-12-04 — End: ?

## 2023-12-04 NOTE — Telephone Encounter (Signed)
 Hello,    Glad to hear the primidone  is helping to a certain degree. If you are tolerating it, let's try a morning dose, so you will take 50 mg in the morning and 50 mg in the evening.    Best,  Harlene Cassis, MD  Movement Disorders  Assistant Professor of Neurology

## 2023-12-05 NOTE — Telephone Encounter (Signed)
 Yes a new prescription was sent in yesterday.     Best,  Harlene Cassis, MD  Movement Disorders  Assistant Professor of Neurology

## 2023-12-09 ENCOUNTER — Encounter (INDEPENDENT_AMBULATORY_CARE_PROVIDER_SITE_OTHER): Payer: Self-pay | Admitting: Neurology

## 2023-12-18 ENCOUNTER — Encounter (INDEPENDENT_AMBULATORY_CARE_PROVIDER_SITE_OTHER): Payer: Self-pay | Admitting: Clinical Neuropsychologist

## 2024-02-21 ENCOUNTER — Ambulatory Visit: Payer: Self-pay | Admitting: NURSE PRACTITIONER

## 2024-06-20 ENCOUNTER — Ambulatory Visit: Payer: Self-pay | Admitting: Neurology
